# Patient Record
Sex: Male | Born: 1965 | Race: Black or African American | Hispanic: No | Marital: Single | Smoking: Never smoker
Health system: Southern US, Community
[De-identification: ages and names within clinical notes are randomized; demographics above are authoritative.]

## PROBLEM LIST (undated history)

## (undated) DIAGNOSIS — E119 Type 2 diabetes mellitus without complications: Secondary | ICD-10-CM

## (undated) DIAGNOSIS — I10 Essential (primary) hypertension: Secondary | ICD-10-CM

---

## 2013-06-05 HISTORY — PX: CATARACT EXTRACTION, BILATERAL: SHX1313

## 2018-09-28 ENCOUNTER — Encounter (HOSPITAL_BASED_OUTPATIENT_CLINIC_OR_DEPARTMENT_OTHER): Payer: Self-pay

## 2018-09-28 ENCOUNTER — Emergency Department (HOSPITAL_BASED_OUTPATIENT_CLINIC_OR_DEPARTMENT_OTHER): Payer: Self-pay

## 2018-09-28 ENCOUNTER — Other Ambulatory Visit: Payer: Self-pay

## 2018-09-28 ENCOUNTER — Emergency Department (HOSPITAL_BASED_OUTPATIENT_CLINIC_OR_DEPARTMENT_OTHER)
Admission: EM | Admit: 2018-09-28 | Discharge: 2018-09-29 | Disposition: A | Discharge status: Home / Self Care | Payer: Self-pay | Attending: Emergency Medicine | Admitting: Emergency Medicine

## 2018-09-28 DIAGNOSIS — E119 Type 2 diabetes mellitus without complications: Secondary | ICD-10-CM | POA: Insufficient documentation

## 2018-09-28 DIAGNOSIS — Z7984 Long term (current) use of oral hypoglycemic drugs: Secondary | ICD-10-CM | POA: Insufficient documentation

## 2018-09-28 DIAGNOSIS — I10 Essential (primary) hypertension: Secondary | ICD-10-CM | POA: Insufficient documentation

## 2018-09-28 DIAGNOSIS — M109 Gout, unspecified: Secondary | ICD-10-CM | POA: Insufficient documentation

## 2018-09-28 DIAGNOSIS — R392 Extrarenal uremia: Secondary | ICD-10-CM | POA: Insufficient documentation

## 2018-09-28 DIAGNOSIS — Z79899 Other long term (current) drug therapy: Secondary | ICD-10-CM | POA: Insufficient documentation

## 2018-09-28 DIAGNOSIS — N19 Unspecified kidney failure: Secondary | ICD-10-CM

## 2018-09-28 HISTORY — DX: Essential (primary) hypertension: I10

## 2018-09-28 HISTORY — DX: Type 2 diabetes mellitus without complications: E11.9

## 2018-09-28 LAB — URIC ACID: Uric Acid, Serum: 11.3 mg/dL — ABNORMAL HIGH (ref 3.7–8.6)

## 2018-09-28 LAB — BASIC METABOLIC PANEL
Anion gap: 11 (ref 5–15)
BUN: 51 mg/dL — ABNORMAL HIGH (ref 6–20)
CO2: 23 mmol/L (ref 22–32)
Calcium: 8.7 mg/dL — ABNORMAL LOW (ref 8.9–10.3)
Chloride: 104 mmol/L (ref 98–111)
Creatinine, Ser: 3.26 mg/dL — ABNORMAL HIGH (ref 0.61–1.24)
GFR calc Af Amer: 24 mL/min — ABNORMAL LOW (ref >60)
GFR calc non Af Amer: 20 mL/min — ABNORMAL LOW (ref >60)
Glucose, Bld: 84 mg/dL (ref 70–99)
Potassium: 3.9 mmol/L (ref 3.5–5.1)
Sodium: 138 mmol/L (ref 135–145)

## 2018-09-28 LAB — CBC WITH DIFFERENTIAL/PLATELET
Abs Immature Granulocytes: 0.02 10*3/uL (ref 0.00–0.07)
Basophils Absolute: 0 10*3/uL (ref 0.0–0.1)
Basophils Relative: 0 %
Eosinophils Absolute: 0.4 10*3/uL (ref 0.0–0.5)
Eosinophils Relative: 3 %
HCT: 35.6 % — ABNORMAL LOW (ref 39.0–52.0)
Hemoglobin: 11 g/dL — ABNORMAL LOW (ref 13.0–17.0)
Immature Granulocytes: 0 %
Lymphocytes Relative: 30 %
Lymphs Abs: 3.3 10*3/uL (ref 0.7–4.0)
MCH: 26.1 pg (ref 26.0–34.0)
MCHC: 30.9 g/dL (ref 30.0–36.0)
MCV: 84.6 fL (ref 80.0–100.0)
Monocytes Absolute: 1.4 10*3/uL — ABNORMAL HIGH (ref 0.1–1.0)
Monocytes Relative: 13 %
Neutro Abs: 6 10*3/uL (ref 1.7–7.7)
Neutrophils Relative %: 54 %
Platelets: 220 10*3/uL (ref 150–400)
RBC: 4.21 MIL/uL — ABNORMAL LOW (ref 4.22–5.81)
RDW: 15.4 % (ref 11.5–15.5)
WBC: 11.2 10*3/uL — ABNORMAL HIGH (ref 4.0–10.5)
nRBC: 0 % (ref 0.0–0.2)

## 2018-09-28 MED ORDER — MORPHINE SULFATE (PF) 4 MG/ML IV SOLN
4.0000 mg | Freq: Once | INTRAVENOUS | Status: AC
  Administered 2018-09-28: 21:00:00 4 mg via INTRAVENOUS
  Filled 2018-09-28: qty 1

## 2018-09-28 MED ORDER — HYDROCODONE-ACETAMINOPHEN 5-325 MG PO TABS: 1.0000 | ORAL_TABLET | Freq: Four times a day (QID) | ORAL | 0 refills | Status: AC | PRN

## 2018-09-28 MED ORDER — ONDANSETRON HCL 4 MG/2ML IJ SOLN
4.0000 mg | Freq: Once | INTRAMUSCULAR | Status: AC
  Administered 2018-09-28: 21:00:00 4 mg via INTRAVENOUS
  Filled 2018-09-28: qty 2

## 2018-09-28 MED ORDER — CLINDAMYCIN PHOSPHATE 600 MG/50ML IV SOLN
600.0000 mg | Freq: Once | INTRAVENOUS | Status: AC
  Administered 2018-09-28: 600 mg via INTRAVENOUS
  Filled 2018-09-28: qty 50

## 2018-09-28 NOTE — ED Provider Notes (Addendum)
[facility] Provider Note   CSN: [id number] Arrival date & time: 09/28/18  1757     History   Chief Complaint Chief Complaint  Patient presents with  . Hand Injury  . Arm Swelling    HPI John Stevenson is a 53 y.o. male past medical history of diabetes, hypertension who presents for evaluation of 5 days of left hand/wrist pain and swelling.  He denies any preceding trauma, injury, fall.  He states that the swelling has spread down his fingers and up to his wrist.  He also reports some pain to his left elbow and feels like it is swollen.  He states he has noticed some overlying warmth but has not noticed if there is been any erythema.  He states 2 days ago, he measured a temperature of 99.  Since then has not had any other fevers.  He states that he has worsening pain when attempting to move his wrist and states he has limited range of motion of his fingers secondary to pain and swelling.  Patient states that he takes metformin for his diabetes.  He has not been recently checking his blood sugars.  He denies any numbness/weakness.  He denies any chest pain, difficulty breathing, nausea/vomiting.     The history is provided by the patient.    Past Medical History:  Diagnosis Date  . Diabetes mellitus without complication ([place])   . Hypertension     There are no active problems to display for this patient.   History reviewed. No pertinent surgical history.      Home Medications    Prior to Admission medications   Medication Sig Start Date End Date Taking? Authorizing Provider  carvedilol (COREG) 12.5 MG tablet Take by mouth. 03/18/16  Yes [provider]  glipiZIDE (GLUCOTROL XL) 2.5 MG 24 hr tablet Take by mouth. 03/19/16  Yes [provider]  torsemide (DEMADEX) 10 MG tablet Take by mouth. 12/23/16  Yes [provider]  HYDROcodone-acetaminophen (NORCO/VICODIN) 5-325 MG tablet Take 1 tablet by mouth every 6 (six) hours as  needed. 09/28/18   Drenda Freeze, MD  methylPREDNISolone (MEDROL DOSEPAK) 4 MG TBPK tablet As directed 09/29/18   Orpah Greek, MD    Family History History reviewed. No pertinent family history.  Social History Social History   Tobacco Use  . Smoking status: Never Smoker  . Smokeless tobacco: Never Used  Substance Use Topics  . Alcohol use: Not Currently  . Drug use: Not Currently     Allergies   Patient has no known allergies.   Review of Systems Review of Systems  Constitutional: Positive for fever.  Respiratory: Negative for cough and shortness of breath.   Cardiovascular: Negative for chest pain.  Gastrointestinal: Negative for abdominal pain, nausea and vomiting.  Genitourinary: Positive for dysuria.  Musculoskeletal:       Hand pain Wrist pain  Skin: Positive for color change.  Neurological: Negative for weakness and numbness.  All other systems reviewed and are negative.    Physical Exam Updated Vital Signs BP 132/78 (BP Location: Right Arm)   Pulse 72   Temp 98.8 F (37.1 C) (Oral)   Resp 16   Ht 5\' 5"  (1.651 m)   Wt 106.6 kg   SpO2 99%   BMI 39.11 kg/m   Physical Exam Vitals signs and nursing note reviewed.  Constitutional:      Appearance: Normal appearance. He is well-developed.  HENT:     Head:  Normocephalic and atraumatic.  Eyes:     General: Lids are normal.     Conjunctiva/sclera: Conjunctivae normal.     Pupils: Pupils are equal, round, and reactive to light.  Neck:     Musculoskeletal: Full passive range of motion without pain.  Cardiovascular:     Rate and Rhythm: Normal rate and regular rhythm.     Pulses: Normal pulses.          Radial pulses are 2+ on the right side and 2+ on the left side.     Heart sounds: Normal heart sounds. No murmur. No friction rub. No gallop.   Pulmonary:     Effort: Pulmonary effort is normal.     Breath sounds: Normal breath sounds.  Abdominal:     Palpations: Abdomen is soft.  Abdomen is not rigid.     Tenderness: There is no abdominal tenderness. There is no guarding.  Musculoskeletal: Normal range of motion.     Comments: Tenderness palpation noted to the dorsal aspect of left hand and left wrist. No deformity or crepitus. There is overlying warmth, erythema.  Flexion/extension of wrist intact with any difficulty.  He has limited flexion/tension of all 5 digits secondary to pain.  No point tenderness noted to the flexor tendon.  He does have some diffuse swelling noted to the dorsal aspect of the wrist.  Tenderness palpation in the left elbow with some overlying soft tissue swelling, warmth.  Flexion/extension intact.  No deformity or crepitus noted.   Skin:    General: Skin is warm and dry.     Capillary Refill: Capillary refill takes less than 2 seconds.     Comments: Warmth noted to the left hand, left wrist. Good distal cap refill.  LUE is not dusky in appearance or cool to touch.  Neurological:     Mental Status: He is alert and oriented to person, place, and time.  Psychiatric:        Speech: Speech normal.      ED Treatments / Results  Labs (all labs ordered are listed, but only abnormal results are displayed) Labs Reviewed  BASIC METABOLIC PANEL - Abnormal; Notable for the following components:      Result Value   BUN 51 (*)    Creatinine, Ser 3.26 (*)    Calcium 8.7 (*)    GFR calc non Af Amer 20 (*)    GFR calc Af Amer 24 (*)    All other components within normal limits  CBC WITH DIFFERENTIAL/PLATELET - Abnormal; Notable for the following components:   WBC 11.2 (*)    RBC 4.21 (*)    Hemoglobin 11.0 (*)    HCT 35.6 (*)    Monocytes Absolute 1.4 (*)    All other components within normal limits  URIC ACID - Abnormal; Notable for the following components:   Uric Acid, Serum 11.3 (*)    All other components within normal limits  SYNOVIAL CELL COUNT + DIFF, W/ CRYSTALS - Abnormal; Notable for the following components:   Color, Synovial AMBER  (*)    Appearance-Synovial CLOUDY (*)    WBC, Synovial 70,853 (*)    Neutrophil, Synovial 87 (*)    Monocyte-Macrophage-Synovial Fluid 13 (*)    All other components within normal limits  BODY FLUID CULTURE  GLUCOSE, BODY FLUID OTHER  PROTEIN, BODY FLUID (OTHER)  URIC ACID, BODY FLUID    EKG None  Radiology Dg Elbow Complete Left  Result Date: 09/28/2018 CLINICAL DATA:  Left  hand and elbow pain, swelling. No known injury. EXAM: LEFT ELBOW - COMPLETE 3+ VIEW COMPARISON:  None. FINDINGS: There is a left elbow joint effusion. No fracture, subluxation or dislocation. Mild degenerative changes. Diffuse soft tissue swelling. IMPRESSION: Soft tissue swelling and left elbow joint effusion. No fracture seen, but findings would be suspicious for occult fracture if there has been trauma. If so, consider immobilization and repeat imaging in 1 week if symptoms persist. Electronically Signed   By: Rolm Baptise M.D.   On: 09/28/2018 19:31   Dg Wrist Complete Left  Result Date: 09/28/2018 CLINICAL DATA:  Left hand and elbow pain.  Swelling. EXAM: LEFT WRIST - COMPLETE 3+ VIEW COMPARISON:  Hand series performed today FINDINGS: No acute bony abnormality. Specifically, no fracture, subluxation, or dislocation. Diffuse soft tissue swelling. Vascular calcifications. IMPRESSION: No acute bony abnormality. Electronically Signed   By: Rolm Baptise M.D.   On: 09/28/2018 19:27   US Venous Img Upper Left (dvt Study)  Result Date: 09/28/2018 CLINICAL DATA:  53 year old male with left posterior elbow pain and swelling x5 days. EXAM: Left UPPER EXTREMITY VENOUS DOPPLER ULTRASOUND TECHNIQUE: Gray-scale sonography with graded compression, as well as color Doppler and duplex ultrasound were performed to evaluate the upper extremity deep venous system from the level of the subclavian vein and including the jugular, axillary, basilic, radial, ulnar and upper cephalic vein. Spectral Doppler was utilized to evaluate  flow at rest and with distal augmentation maneuvers. COMPARISON:  None. FINDINGS: Contralateral Subclavian Vein: Respiratory phasicity is normal and symmetric with the symptomatic side. No evidence of thrombus. Normal compressibility. Internal Jugular Vein: No evidence of thrombus. Normal compressibility, respiratory phasicity and response to augmentation. Subclavian Vein: No evidence of thrombus. Normal compressibility, respiratory phasicity and response to augmentation. Axillary Vein: No evidence of thrombus. Normal compressibility, respiratory phasicity and response to augmentation. Cephalic Vein: No evidence of thrombus. Normal compressibility, respiratory phasicity and response to augmentation. Basilic Vein: No evidence of thrombus. Normal compressibility, respiratory phasicity and response to augmentation. Brachial Veins: No evidence of thrombus. Normal compressibility, respiratory phasicity and response to augmentation. Radial Veins: No evidence of thrombus. Normal compressibility, respiratory phasicity and response to augmentation. Ulnar Veins: No evidence of thrombus. Normal compressibility, respiratory phasicity and response to augmentation. Venous Reflux:  None visualized. Other Findings:  None visualized. IMPRESSION: No evidence of DVT within the left upper extremity. Electronically Signed   By: Anner Crete M.D.   On: 09/28/2018 20:09   Dg Hand Complete Left  Result Date: 09/28/2018 CLINICAL DATA:  Hand pain, swelling EXAM: LEFT HAND - COMPLETE 3+ VIEW COMPARISON:  None. FINDINGS: No acute bony abnormality. Specifically, no fracture, subluxation, or dislocation. Vascular calcifications. Diffuse soft tissue swelling IMPRESSION: No acute bony abnormality. Electronically Signed   By: Rolm Baptise M.D.   On: 09/28/2018 19:27    Procedures Procedures (including critical care time)  Medications Ordered in ED Medications  clindamycin (CLEOCIN) IVPB 600 mg ( Intravenous Stopped 09/28/18 2029)   morphine 4 MG/ML injection 4 mg (4 mg Intravenous Given 09/28/18 2125)  ondansetron (ZOFRAN) injection 4 mg (4 mg Intravenous Given 09/28/18 2125)  dexamethasone (DECADRON) injection 10 mg (10 mg Intravenous Given 09/29/18 0328)     Initial Impression / Assessment and Plan / ED Course  I have reviewed the triage vital signs and the nursing notes.  Pertinent labs & imaging results that were available during my care of the patient were reviewed by me and considered in my medical decision making (see chart  for details).        53 year old male who presents for evaluation of left hand pain and swelling as well as left elbow pain and swelling.  He reported a fever at home of 99 degrees.  No preceding trauma, injury, fall.  He does not have any history of gout.  On initial ED arrival, he is afebrile, sitting comfortably on examination table.  No signs of distress.  Vital signs are stable.  Concern for gout versus infectious process.  Also consider DVT given extensive swelling.  Low suspicion for septic arthritis given that he is able to move it but he does have warmth and erythema along the joint lines of the wrist as well as the elbow and does have diabetes.  Will plan for basic labs and imaging.  CBC shows leukocytosis of 11.2.  Hemoglobin stable at 11.0.  BMP shows BUN and creatinine of 51 and 3.36.  I reviewed his records.  He has had elevations in his BUN and creatinine previously.  He had episodes in 2017 and 2018 where his creatinine was 3.  He had trended back down between 2.5-2.7.  Most recent when I see was from March 2018 which showed a creatinine of 1.75 at that time.  Uric acid is 11.5.  Elbow x-ray shows soft tissue swelling and left elbow joint effusion.  No fracture seen.  Findings would be suspicious if he had an occult fracture if there is been trauma.  Patient did not have any preceding trauma, injury, fall.  Imaging of hand and wrist is without any acute bony abnormality.   Arthrocentesis done by Dr. Darl Householder.  Please see his note. Joint fluid sample sent.  Patient signed out with arthrocentesis labs pending.   Portions of this note were generated with [employer]. Dictation errors may occur despite best attempts at proofreading.   Final Clinical Impressions(s) / ED Diagnoses   Final diagnoses:  Uremia  Acute gout of left elbow, unspecified cause    ED Discharge Orders         Ordered    methylPREDNISolone (MEDROL DOSEPAK) 4 MG TBPK tablet     09/29/18 0320    HYDROcodone-acetaminophen (NORCO/VICODIN) 5-325 MG tablet  Every 6 hours PRN     09/28/18 2247           Volanda Napoleon, PA-C 09/28/18 2149    Volanda Napoleon, PA-C 09/29/18 1230    Drenda Freeze, MD 10/04/18 1401

## 2018-09-28 NOTE — ED Notes (Signed)
Patient transported to Ultrasound 

## 2018-09-28 NOTE — ED Triage Notes (Signed)
Pt states beginning Wednesday swelling left hand and elbow.  Pt states fever Wednesday, today 99.  Also has described swelling of extremities, b/p elevated.  Hx Diabetes, unable to check fingersticks, has been out of test strips. Denies chest pain, shortness of breath.

## 2018-09-28 NOTE — ED Notes (Signed)
Patient transported to X-ray 

## 2018-09-29 LAB — SYNOVIAL CELL COUNT + DIFF, W/ CRYSTALS
Eosinophils-Synovial: 0 % (ref 0–1)
Lymphocytes-Synovial Fld: 0 % (ref 0–20)
Monocyte-Macrophage-Synovial Fluid: 13 % — ABNORMAL LOW (ref 50–90)
Neutrophil, Synovial: 87 % — ABNORMAL HIGH (ref 0–25)
WBC, Synovial: 70853 /mm3 — ABNORMAL HIGH (ref 0–200)

## 2018-09-29 MED ORDER — DEXAMETHASONE SODIUM PHOSPHATE 10 MG/ML IJ SOLN
10.0000 mg | Freq: Once | INTRAMUSCULAR | Status: AC
  Administered 2018-09-29: 03:00:00 10 mg via INTRAVENOUS
  Filled 2018-09-29: qty 1

## 2018-09-29 MED ORDER — METHYLPREDNISOLONE 4 MG PO TBPK: ORAL_TABLET | ORAL | 0 refills | Status: AC

## 2018-09-29 NOTE — Discharge Instructions (Addendum)
The fluid drained from your elbow indicated that you have gout.  We cannot be completely certain that there is not also infection until the culture comes back.  This will take at least 2 days.  If you develop increasing pain, swelling, redness or develop a fever at any time, come back to the ER immediately.  The kidney function labs (creatinine) was slightly elevated today as well, compared to your previous numbers.  Please have your nephrologist review this.

## 2018-09-29 NOTE — ED Notes (Signed)
Pt sleeping, lab to call [facility] for update on lab results.

## 2018-09-29 NOTE — ED Provider Notes (Addendum)
Patient signed out to me to follow-up on synovial fluid studies.  Patient seen with left elbow pain.  Synovial fluid studies have not been performed.  Specifically, intracellular monosodium urate crystals are seen indicating gout.  Synovial white blood cell count that is 70,853.  This is within accepted range for gout, although on the higher end of the range.  The case was therefore discussed with Dr. Ninfa Linden, on-call for orthopedics.  He did confirm that the white blood cell count of 70,000 in synovial fluid was unremarkable in the setting of gout and does not require urgent intervention (no need for evaluation for concomitant infection).  Agrees with steroid treatment. He can see the patient for follow-up in the office.    Synovial fluid culture pending.  Will give dose of Decadron and analgesia.  I did extensively counseled the patient and his wife about the need for return to the ER if he has worsening symptoms over the next couple of days.   Orpah Greek, MD 09/29/18 0302    Orpah Greek, MD 09/29/18 [phone]

## 2018-09-30 LAB — URIC ACID, BODY FLUID: Uric Acid Body Fluid: 10.7 mg/dL

## 2018-10-01 LAB — BODY FLUID CULTURE: Culture: NO GROWTH

## 2018-10-02 LAB — GLUCOSE, BODY FLUID OTHER: Glucose, Body Fluid Other: 2 mg/dL

## 2018-10-02 NOTE — ED Provider Notes (Signed)
  Physical Exam  BP 132/78 (BP Location: Right Arm)   Pulse 72   Temp 98.8 F (37.1 C) (Oral)   Resp 16   Ht 5\' 5"  (1.651 m)   Wt 106.6 kg   SpO2 99%   BMI 39.11 kg/m   Physical Exam  ED Course/Procedures     .Joint Aspiration/Arthrocentesis  Date/Time: 10/02/2018 8:45 PM Performed by: Drenda Freeze, MD Authorized by: Drenda Freeze, MD   Consent:    Consent obtained:  Verbal   Consent given by:  Patient   Risks discussed:  Bleeding and infection   Alternatives discussed:  No treatment Location:    Location:  Elbow   Elbow:  L elbow Anesthesia (see MAR for exact dosages):    Anesthesia method:  Local infiltration   Local anesthetic:  Lidocaine 2% w/o epi Procedure details:    Preparation: Patient was prepped and draped in usual sterile fashion     Needle gauge:  18 G   Ultrasound guidance: yes     Approach:  Posterior   Aspirate amount:  6   Aspirate characteristics:  Serous   Steroid injected: no     Specimen collected: yes   Post-procedure details:    Dressing:  Adhesive bandage   Patient tolerance of procedure:  Tolerated well, no immediate complications    MDM  Medical screening examination/treatment/procedure(s) were conducted as a shared visit with non-physician practitioner(s) and myself.  I personally evaluated the patient during the encounter.      John Stevenson is a 53 y.o. male here with L arm swelling for several days. Also has bilateral leg swelling as well. No trauma or injury. Denies fevers. On exam, L elbow is warm and dec ROM. The entire forearm and hand is swollen and warm as well but nl radial pulses and able to hand grasp. 2+ pitting edema bilateral legs. Consider cellulitis vs DVT vs gout. Labs showed Cr of 3.2, which is slightly worse than baseline. His uric acid level is elevated. DVT study negative. Bedside US showed some fluid posterior to the elbow. Arthrocentesis performed and 6 cc of fluid came out. Still likely to be gout,  consider septic joint as well. Signed out to Dr. Betsey Holiday to follow up arthrocentesis results.        Drenda Freeze, MD 10/02/18 2049

## 2018-10-03 LAB — PROTEIN, BODY FLUID (OTHER): Total Protein, Body Fluid Other: 5.3 g/dL

## 2020-11-24 DIAGNOSIS — E119 Type 2 diabetes mellitus without complications: Secondary | ICD-10-CM | POA: Insufficient documentation

## 2020-11-24 DIAGNOSIS — I10 Essential (primary) hypertension: Secondary | ICD-10-CM | POA: Insufficient documentation

## 2020-11-26 NOTE — Progress Notes (Deleted)
Cardiology Office Note:    Date:  11/26/2020   ID:  John Stevenson, DOB 10-10-1965, MRN [id number]  PCP:  [facility]  Cardiologist:  Shirlee More, MD   Referring MD: Drue Flirt, MD  ASSESSMENT:    No diagnosis found. PLAN:    In order of problems listed above:  ***  Next appointment   Medication Adjustments/Labs and Tests Ordered: Current medicines are reviewed at length with the patient today.  Concerns regarding medicines are outlined above.  No orders of the defined types were placed in this encounter.  No orders of the defined types were placed in this encounter.    No chief complaint on file. ***  History of Present Illness:    John Stevenson is a 55 y.o. male who is being seen today for the evaluation of heart failure at the request of Drue Flirt, MD.  He was seen previously [facility] cardiology in [facility] 05/31/2016 he had a history of cardiomyopathy EF 25 to 30% in the setting of longstanding hypertension with normalization of ejection fraction chronic systolic heart failure type 2 diabetes stage III CKD and a history of previous pulmonary embolism in 2012. Past Medical History:  Diagnosis Date   Diabetes mellitus without complication ([place])    Hypertension     Past Surgical History:  Procedure Laterality Date   CATARACT EXTRACTION, BILATERAL  06/05/2013    Current Medications: No outpatient medications have been marked as taking for the 11/27/20 encounter (Appointment) with Richardo Priest, MD.     Allergies:   Patient has no known allergies.   Social History   Socioeconomic History   Marital status: Single    Spouse name: Not on file   Number of children: Not on file   Years of education: Not on file   Highest education level: Not on file  Occupational History   Not on file  Tobacco Use   Smoking status: Never   Smokeless tobacco: Never  Substance and Sexual Activity   Alcohol use: Not Currently    Drug use: Not Currently   Sexual activity: Not on file  Other Topics Concern   Not on file  Social History Narrative   Not on file   Social Determinants of Health   Financial Resource Strain: Not on file  Food Insecurity: Not on file  Transportation Needs: Not on file  Physical Activity: Not on file  Stress: Not on file  Social Connections: Not on file     Family History: The patient's ***family history is not on file.  ROS:   ROS Please see the history of present illness.    *** All other systems reviewed and are negative.  EKGs/Labs/Other Studies Reviewed:    The following studies were reviewed today: ***  EKG:  EKG is *** ordered today.  The ekg ordered today is personally reviewed and demonstrates ***  Recent Labs: No results found for requested labs within last 8760 hours.  Recent Lipid Panel No results found for: CHOL, TRIG, HDL, CHOLHDL, VLDL, LDLCALC, LDLDIRECT  Physical Exam:    VS:  There were no vitals taken for this visit.    Wt Readings from Last 3 Encounters:  09/28/18 235 lb (106.6 kg)     GEN: *** Well nourished, well developed in no acute distress HEENT: Normal NECK: No JVD; No carotid bruits LYMPHATICS: No lymphadenopathy CARDIAC: ***RRR, no murmurs, rubs, gallops RESPIRATORY:  Clear to auscultation without rales, wheezing or rhonchi  ABDOMEN: Soft, non-tender, non-distended MUSCULOSKELETAL:  No edema; No deformity  SKIN: Warm and dry NEUROLOGIC:  Alert and oriented x 3 PSYCHIATRIC:  Normal affect     Signed, Shirlee More, MD  11/26/2020 7:05 PM    [place]

## 2020-11-27 ENCOUNTER — Ambulatory Visit: Payer: Self-pay | Admitting: Cardiology

## 2020-12-30 ENCOUNTER — Other Ambulatory Visit: Payer: Self-pay | Admitting: Nephrology

## 2020-12-30 DIAGNOSIS — N184 Chronic kidney disease, stage 4 (severe): Secondary | ICD-10-CM

## 2021-02-23 ENCOUNTER — Other Ambulatory Visit (HOSPITAL_BASED_OUTPATIENT_CLINIC_OR_DEPARTMENT_OTHER): Payer: Self-pay | Admitting: Nephrology

## 2021-02-23 DIAGNOSIS — N184 Chronic kidney disease, stage 4 (severe): Secondary | ICD-10-CM

## 2021-02-24 ENCOUNTER — Telehealth (HOSPITAL_BASED_OUTPATIENT_CLINIC_OR_DEPARTMENT_OTHER): Payer: Self-pay

## 2021-02-26 IMAGING — CR LEFT HAND - COMPLETE 3+ VIEW
3 series · 3 of 3 positions shown · non-contrast
Comparison: None.

CLINICAL DATA: Hand pain, swelling

EXAM:
LEFT HAND - COMPLETE 3+ VIEW

[x hand pa left]
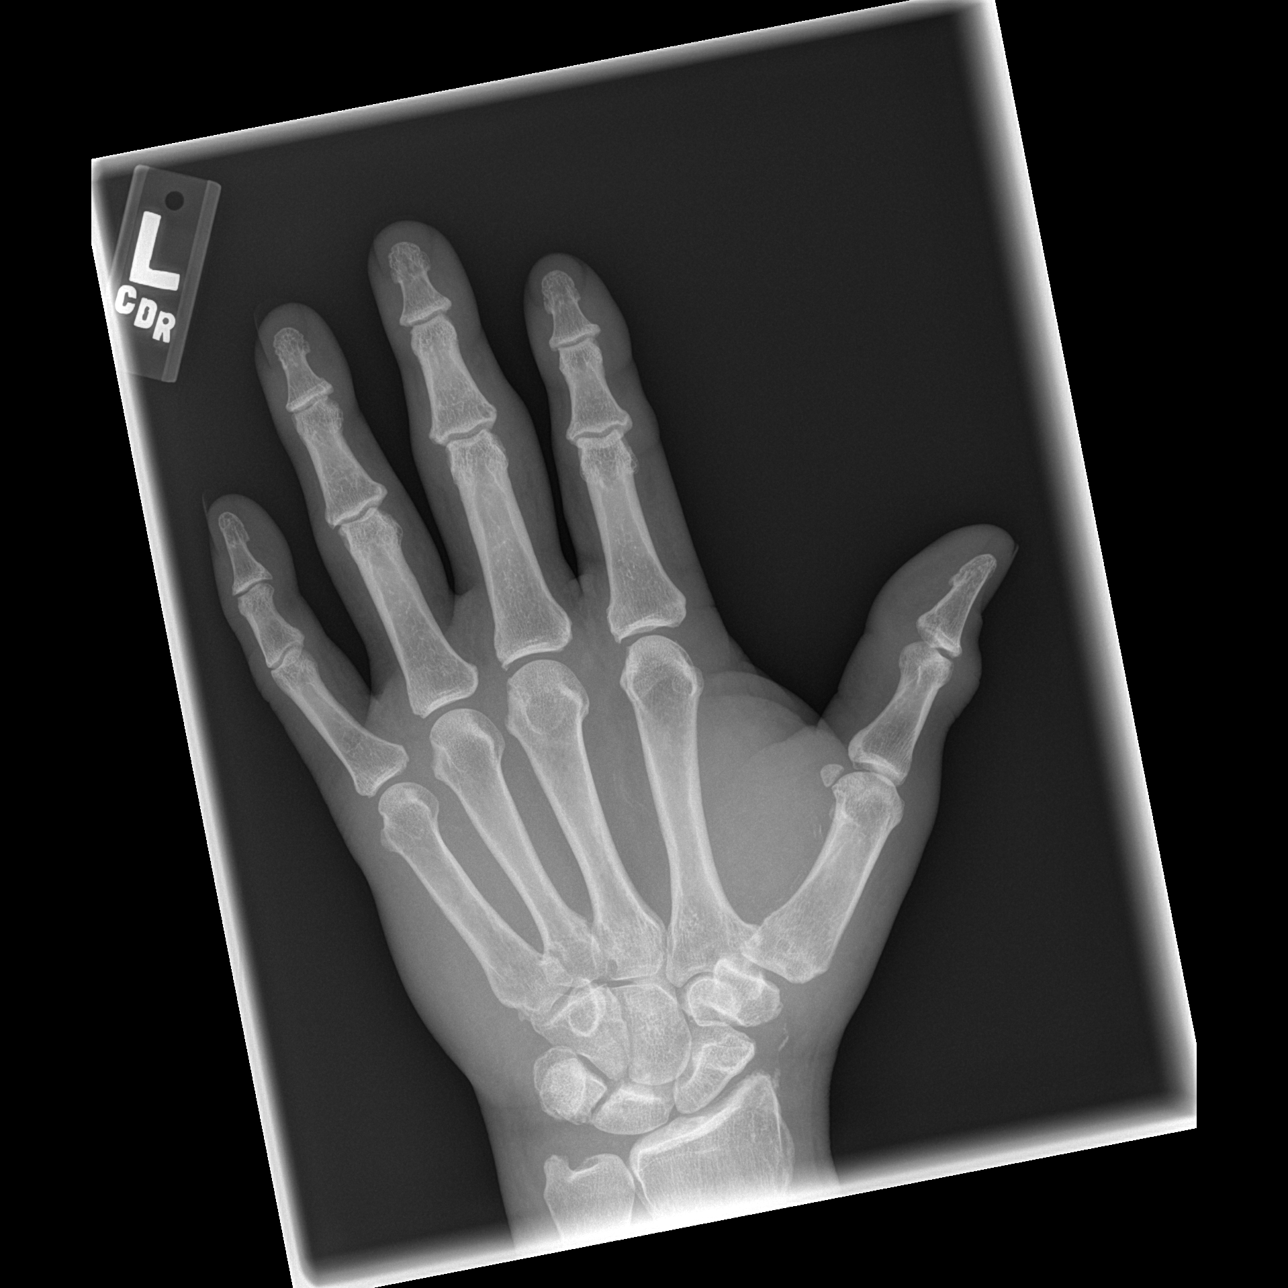

[x hand oblique left]
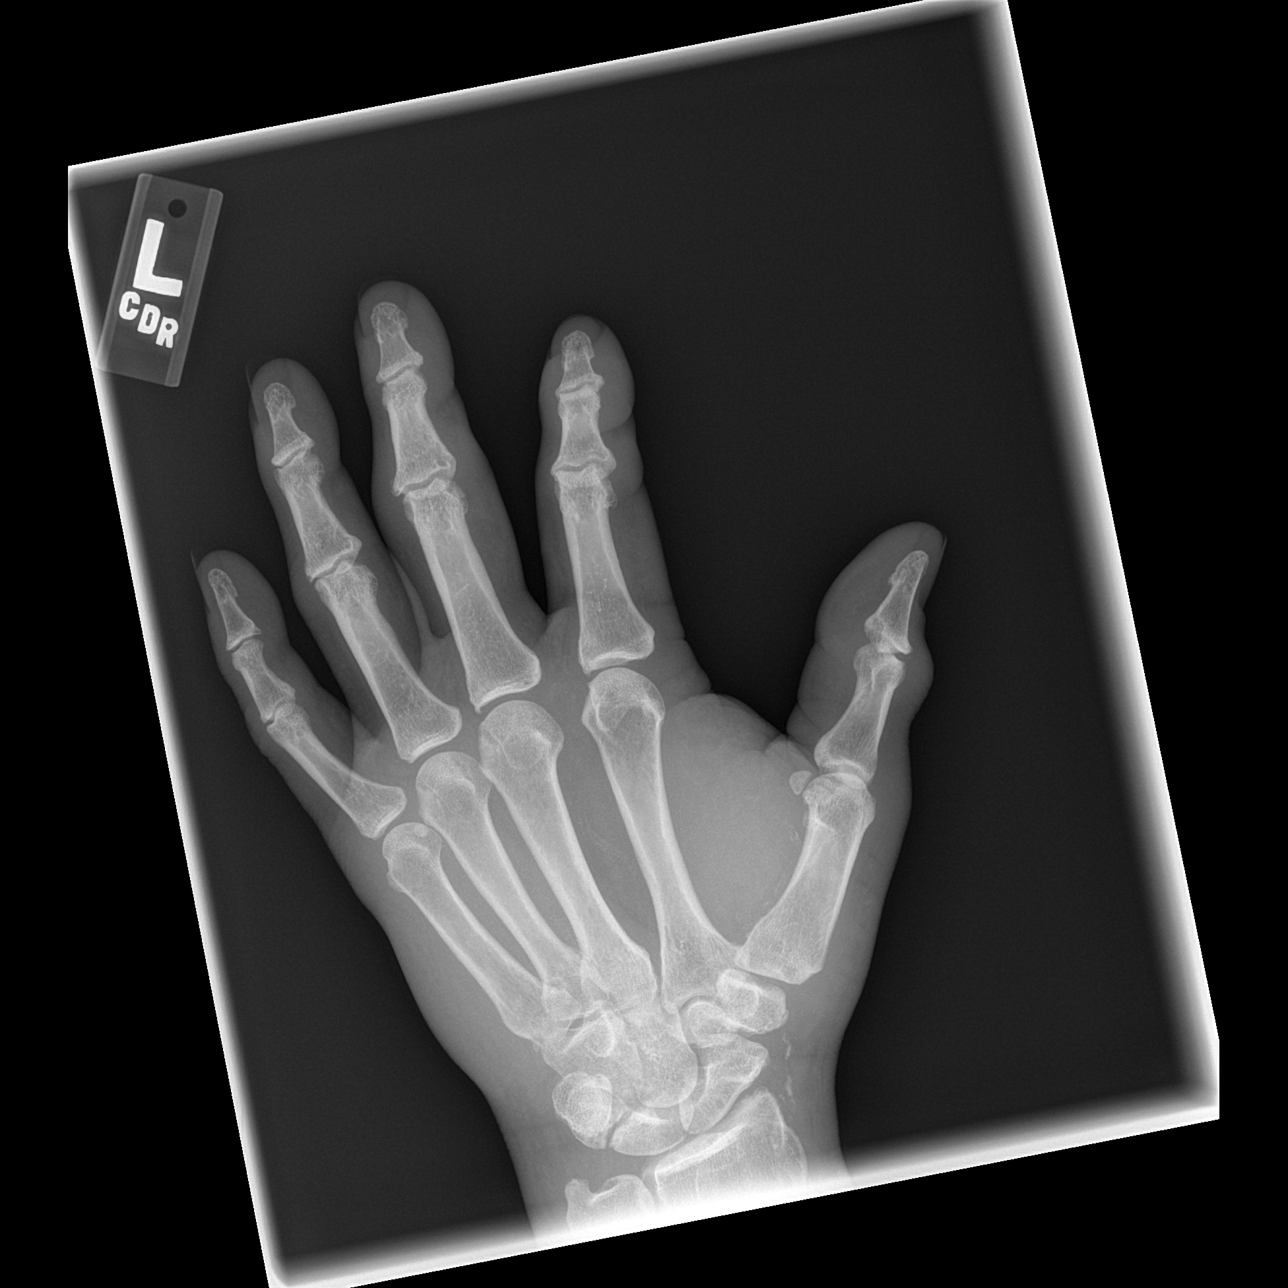

[x hand lat left]
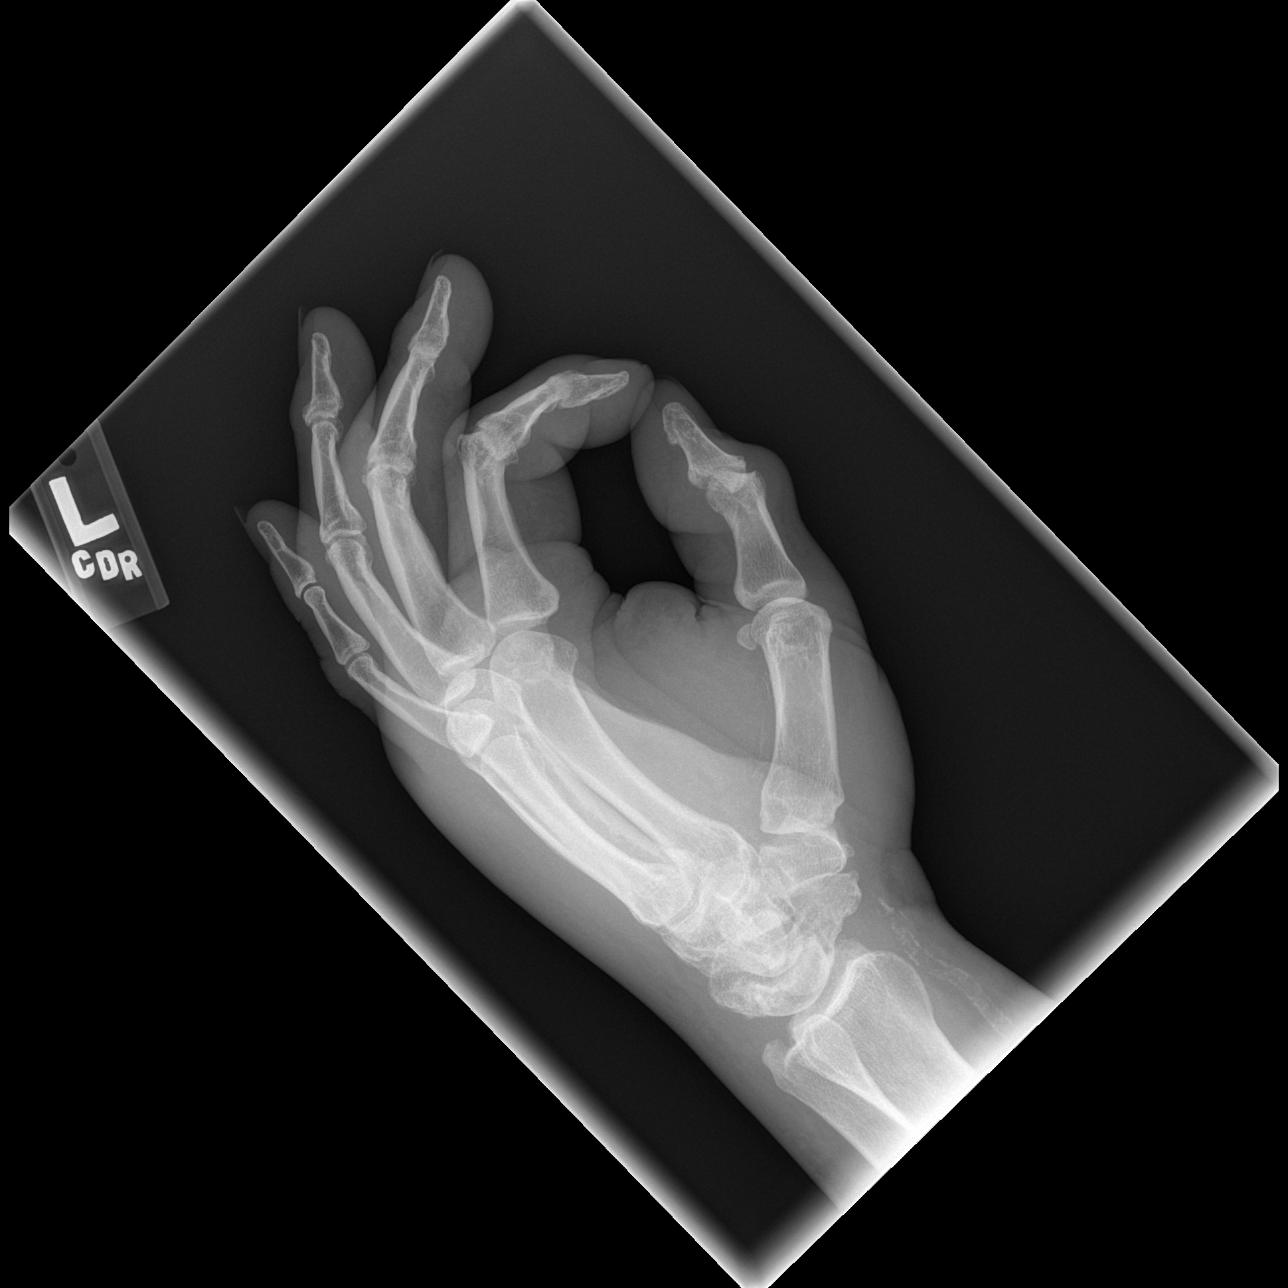

[3 of 3 positions shown; findings below may reference images not displayed]

FINDINGS: No acute bony abnormality. Specifically, no fracture, subluxation,
or dislocation. Vascular calcifications. Diffuse soft tissue
swelling
IMPRESSION: No acute bony abnormality.
